# Patient Record
Sex: Female | Born: 1990 | Race: Black or African American | Hispanic: No | Marital: Single | Smoking: Never smoker
Health system: Southern US, Community
[De-identification: ages and names within clinical notes are randomized; demographics above are authoritative.]

---

## 2012-06-19 ENCOUNTER — Encounter (HOSPITAL_COMMUNITY): Payer: Self-pay | Admitting: *Deleted

## 2012-06-19 ENCOUNTER — Emergency Department (HOSPITAL_COMMUNITY)
Admission: EM | Admit: 2012-06-19 | Discharge: 2012-06-19 | Disposition: A | Discharge status: Home / Self Care | Payer: No Typology Code available for payment source | Attending: Emergency Medicine | Admitting: Emergency Medicine

## 2012-06-19 DIAGNOSIS — K0889 Other specified disorders of teeth and supporting structures: Secondary | ICD-10-CM

## 2012-06-19 DIAGNOSIS — K089 Disorder of teeth and supporting structures, unspecified: Secondary | ICD-10-CM | POA: Insufficient documentation

## 2012-06-19 MED ORDER — PENICILLIN V POTASSIUM 500 MG PO TABS: 500.0000 mg | ORAL_TABLET | Freq: Three times a day (TID) | ORAL | Status: AC

## 2012-06-19 MED ORDER — HYDROCODONE-ACETAMINOPHEN 5-500 MG PO TABS: 1.0000 | ORAL_TABLET | Freq: Four times a day (QID) | ORAL | Status: AC | PRN

## 2012-06-19 NOTE — ED Provider Notes (Signed)
History     CSN: [id number]  Arrival date & time 06/19/12  4098   First MD Initiated Contact with Patient 06/19/12 0815      Chief Complaint  Patient presents with  . Dental Pain    (Consider location/radiation/quality/duration/timing/severity/associated sxs/prior treatment) HPI Comments: Patient with history of right upper molar that is decayed.  Last night struck left side of face on handrail.  Complains of mouth pain.  Patient is a 21 y.o. female presenting with tooth pain. The history is provided by the patient.  Dental PainThe primary symptoms include mouth pain. The symptoms began 2 days ago. The symptoms are worsening. The symptoms are new. The symptoms occur constantly.  Additional symptoms include: dental sensitivity to temperature, gum swelling and gum tenderness.    History reviewed. No pertinent past medical history.  History reviewed. No pertinent past surgical history.  No family history on file.  History  Substance Use Topics  . Smoking status: Never Smoker   . Smokeless tobacco: Not on file  . Alcohol Use: No    OB History    Grav Para Term Preterm Abortions TAB SAB Ect Mult Living                  Review of Systems  All other systems reviewed and are negative.    Allergies  Review of patient's allergies indicates no known allergies.  Home Medications  No current outpatient prescriptions on file.  BP 136/81  Temp 98.5 F (36.9 C) (Oral)  Resp 18  SpO2 100%  Physical Exam  Nursing note and vitals reviewed. Constitutional: She is oriented to person, place, and time. She appears well-developed and well-nourished. No distress.  HENT:  Head: Normocephalic and atraumatic.       The right upper 2nd molar is heavily decayed.  There is bruising to the inside of the left cheek but no laceration.  There is no malocclusion of the teeth.  Neck: Normal range of motion. Neck supple.  Neurological: She is alert and oriented to person, place, and time.    Skin: Skin is warm and dry. She is not diaphoretic.    ED Course  Procedures (including critical care time)  Labs Reviewed - No data to display No results found.   No diagnosis found.    MDM  Will treat with penicillin, lortab.  Needs dental follow up.        Geoffery Lyons, MD 06/19/12 [phone]
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
## 2013-04-19 ENCOUNTER — Encounter (HOSPITAL_COMMUNITY): Payer: Self-pay | Admitting: Emergency Medicine

## 2013-04-19 ENCOUNTER — Emergency Department (HOSPITAL_COMMUNITY)
Admission: EM | Admit: 2013-04-19 | Discharge: 2013-04-19 | Disposition: A | Discharge status: Home / Self Care | Payer: No Typology Code available for payment source | Attending: Emergency Medicine | Admitting: Emergency Medicine

## 2013-04-19 DIAGNOSIS — K089 Disorder of teeth and supporting structures, unspecified: Secondary | ICD-10-CM | POA: Insufficient documentation

## 2013-04-19 DIAGNOSIS — Z9889 Other specified postprocedural states: Secondary | ICD-10-CM | POA: Insufficient documentation

## 2013-04-19 DIAGNOSIS — K0889 Other specified disorders of teeth and supporting structures: Secondary | ICD-10-CM

## 2013-04-19 MED ORDER — HYDROCODONE-ACETAMINOPHEN 5-325 MG PO TABS: 1.0000 | ORAL_TABLET | ORAL | Status: AC | PRN

## 2013-04-19 MED ORDER — HYDROCODONE-ACETAMINOPHEN 5-325 MG PO TABS
1.0000 | ORAL_TABLET | Freq: Once | ORAL | Status: AC
  Administered 2013-04-19: 1 via ORAL
  Filled 2013-04-19: qty 1

## 2013-04-19 MED ORDER — PENICILLIN V POTASSIUM 500 MG PO TABS: 500.0000 mg | ORAL_TABLET | Freq: Three times a day (TID) | ORAL | Status: AC

## 2013-04-19 NOTE — ED Notes (Signed)
PT. REPORTS RIGHT UPPER MOLAR PAIN ONSET LAST NIGHT UNRELIEVED BY OTC IBUPROFEN AND BC POWDER.

## 2013-04-19 NOTE — ED Notes (Signed)
Pt. States that she had a filing placed in her right upper molar 8 yrs. Ago and 5 months ago it fell out. Started hurting around 2300 last night.

## 2013-04-19 NOTE — ED Provider Notes (Signed)
Medical screening examination/treatment/procedure(s) were performed by non-physician practitioner and as supervising physician I was immediately available for consultation/collaboration.  Olivia Mackie, MD 04/19/13 [phone]

## 2013-04-19 NOTE — ED Provider Notes (Signed)
History     CSN: [id number]  Arrival date & time 04/19/13  0413   None     Chief Complaint  Patient presents with  . Dental Pain    (Consider location/radiation/quality/duration/timing/severity/associated sxs/prior treatment) HPI History provided by pt.   Pt has had had right upper toothache since last night.  Reports that her filling came out.  No relief w/ ibuprofen or goody powder.  No associated fever.  Does not have a dentist.  History reviewed. No pertinent past medical history.  History reviewed. No pertinent past surgical history.  No family history on file.  History  Substance Use Topics  . Smoking status: Never Smoker   . Smokeless tobacco: Not on file  . Alcohol Use: No    OB History   Grav Para Term Preterm Abortions TAB SAB Ect Mult Living                  Review of Systems  All other systems reviewed and are negative.    Allergies  Review of patient's allergies indicates no known allergies.  Home Medications   Current Outpatient Rx  Name  Route  Sig  Dispense  Refill  . ibuprofen (ADVIL,MOTRIN) 200 MG tablet   Oral   Take 200 mg by mouth every 6 (six) hours as needed for pain.         Marland Kitchen HYDROcodone-acetaminophen (NORCO/VICODIN) 5-325 MG per tablet   Oral   Take 1 tablet by mouth every 4 (four) hours as needed for pain.   12 tablet   0   . penicillin v potassium (VEETID) 500 MG tablet   Oral   Take 1 tablet (500 mg total) by mouth 3 (three) times daily.   30 tablet   0     BP 149/112  Pulse 104  Temp(Src) 99.3 F (37.4 C) (Oral)  Resp 14  SpO2 100%  LMP 03/17/2013  Physical Exam  Nursing note and vitals reviewed. Constitutional: She is oriented to person, place, and time. She appears well-developed and well-nourished. No distress.  HENT:  Head: Normocephalic and atraumatic.  Right upper second molar w/ advanced carie.  Extremely ttp.  Multiple other fillings.  Mild, diffuse gingiva.  No edema of buccal mucosa.    Eyes:   Normal appearance  Neck: Normal range of motion.  Pulmonary/Chest: Effort normal.  Musculoskeletal: Normal range of motion.  Lymphadenopathy:    She has no cervical adenopathy.  Neurological: She is alert and oriented to person, place, and time.  Psychiatric: She has a normal mood and affect. Her behavior is normal.    ED Course  Procedures (including critical care time)  Labs Reviewed - No data to display No results found.   1. Toothache       MDM  22yo F presents w/ right upper 2nd molar pain.  Exam concerning for periapical abscess.  Prescribed penicillin and 12 vicodin and referred to dentist on call.  She is aware that she should call to scheduled appt w/in 48 hours. Return precautions discussed.         Otilio Miu, PA-C 04/19/13 [phone]

## 2017-04-16 ENCOUNTER — Encounter (HOSPITAL_COMMUNITY): Payer: Self-pay | Admitting: Emergency Medicine

## 2017-04-16 ENCOUNTER — Emergency Department (HOSPITAL_COMMUNITY): Payer: No Typology Code available for payment source

## 2017-04-16 ENCOUNTER — Emergency Department (HOSPITAL_COMMUNITY)
Admission: EM | Admit: 2017-04-16 | Discharge: 2017-04-16 | Disposition: A | Discharge status: Home / Self Care | Payer: No Typology Code available for payment source | Attending: Emergency Medicine | Admitting: Emergency Medicine

## 2017-04-16 DIAGNOSIS — R1110 Vomiting, unspecified: Secondary | ICD-10-CM | POA: Diagnosis not present

## 2017-04-16 DIAGNOSIS — R079 Chest pain, unspecified: Secondary | ICD-10-CM | POA: Insufficient documentation

## 2017-04-16 DIAGNOSIS — S161XXA Strain of muscle, fascia and tendon at neck level, initial encounter: Secondary | ICD-10-CM | POA: Diagnosis not present

## 2017-04-16 DIAGNOSIS — R51 Headache: Secondary | ICD-10-CM | POA: Diagnosis not present

## 2017-04-16 DIAGNOSIS — Y93I9 Activity, other involving external motion: Secondary | ICD-10-CM | POA: Insufficient documentation

## 2017-04-16 DIAGNOSIS — Y92410 Unspecified street and highway as the place of occurrence of the external cause: Secondary | ICD-10-CM | POA: Diagnosis not present

## 2017-04-16 DIAGNOSIS — R42 Dizziness and giddiness: Secondary | ICD-10-CM | POA: Diagnosis not present

## 2017-04-16 DIAGNOSIS — Y999 Unspecified external cause status: Secondary | ICD-10-CM | POA: Diagnosis not present

## 2017-04-16 DIAGNOSIS — S199XXA Unspecified injury of neck, initial encounter: Secondary | ICD-10-CM | POA: Diagnosis present

## 2017-04-16 DIAGNOSIS — M25512 Pain in left shoulder: Secondary | ICD-10-CM | POA: Diagnosis not present

## 2017-04-16 DIAGNOSIS — R55 Syncope and collapse: Secondary | ICD-10-CM | POA: Insufficient documentation

## 2017-04-16 MED ORDER — IBUPROFEN 800 MG PO TABS
800.0000 mg | ORAL_TABLET | Freq: Once | ORAL | Status: AC
  Administered 2017-04-16: 800 mg via ORAL
  Filled 2017-04-16: qty 1

## 2017-04-16 MED ORDER — OXYCODONE-ACETAMINOPHEN 5-325 MG PO TABS
1.0000 | ORAL_TABLET | Freq: Once | ORAL | Status: AC
  Administered 2017-04-16: 1 via ORAL
  Filled 2017-04-16: qty 1

## 2017-04-16 MED ORDER — ONDANSETRON 4 MG PO TBDP
4.0000 mg | ORAL_TABLET | Freq: Once | ORAL | Status: AC
  Administered 2017-04-16: 4 mg via ORAL
  Filled 2017-04-16: qty 1

## 2017-04-16 MED ORDER — CYCLOBENZAPRINE HCL 10 MG PO TABS: 10.0000 mg | ORAL_TABLET | Freq: Two times a day (BID) | ORAL | 0 refills | Status: AC | PRN
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
## 2018-04-23 ENCOUNTER — Other Ambulatory Visit: Payer: Self-pay | Admitting: Nurse Practitioner

## 2018-04-23 ENCOUNTER — Ambulatory Visit
Admission: RE | Admit: 2018-04-23 | Discharge: 2018-04-23 | Disposition: A | Discharge status: Home / Self Care | Payer: No Typology Code available for payment source | Source: Ambulatory Visit | Attending: Nurse Practitioner | Admitting: Nurse Practitioner

## 2018-04-23 DIAGNOSIS — Z0000 Encounter for general adult medical examination without abnormal findings: Secondary | ICD-10-CM

## 2020-08-10 ENCOUNTER — Other Ambulatory Visit: Payer: No Typology Code available for payment source

## 2020-08-10 DIAGNOSIS — Z20822 Contact with and (suspected) exposure to covid-19: Secondary | ICD-10-CM

## 2020-08-11 LAB — NOVEL CORONAVIRUS, NAA: SARS-CoV-2, NAA: NOT DETECTED

## 2020-08-11 LAB — SARS-COV-2, NAA 2 DAY TAT

## 2020-08-24 ENCOUNTER — Other Ambulatory Visit: Payer: No Typology Code available for payment source

## 2020-08-24 DIAGNOSIS — Z20822 Contact with and (suspected) exposure to covid-19: Secondary | ICD-10-CM

## 2020-08-25 LAB — NOVEL CORONAVIRUS, NAA: SARS-CoV-2, NAA: NOT DETECTED

## 2020-08-25 LAB — SARS-COV-2, NAA 2 DAY TAT

## 2020-09-07 ENCOUNTER — Other Ambulatory Visit: Payer: No Typology Code available for payment source

## 2020-09-08 ENCOUNTER — Other Ambulatory Visit: Payer: No Typology Code available for payment source

## 2020-09-18 ENCOUNTER — Other Ambulatory Visit: Payer: No Typology Code available for payment source

## 2020-09-18 DIAGNOSIS — Z20822 Contact with and (suspected) exposure to covid-19: Secondary | ICD-10-CM

## 2020-09-19 LAB — SARS-COV-2, NAA 2 DAY TAT

## 2020-09-19 LAB — NOVEL CORONAVIRUS, NAA: SARS-CoV-2, NAA: NOT DETECTED

## 2020-09-25 ENCOUNTER — Other Ambulatory Visit: Payer: No Typology Code available for payment source

## 2020-09-25 DIAGNOSIS — Z20822 Contact with and (suspected) exposure to covid-19: Secondary | ICD-10-CM

## 2020-09-26 LAB — NOVEL CORONAVIRUS, NAA: SARS-CoV-2, NAA: NOT DETECTED

## 2020-09-26 LAB — SARS-COV-2, NAA 2 DAY TAT

## 2020-10-01 ENCOUNTER — Other Ambulatory Visit: Payer: No Typology Code available for payment source

## 2020-10-01 DIAGNOSIS — Z20822 Contact with and (suspected) exposure to covid-19: Secondary | ICD-10-CM

## 2020-10-02 ENCOUNTER — Other Ambulatory Visit: Payer: No Typology Code available for payment source

## 2020-10-02 LAB — NOVEL CORONAVIRUS, NAA: SARS-CoV-2, NAA: NOT DETECTED

## 2020-10-02 LAB — SARS-COV-2, NAA 2 DAY TAT

## 2020-10-09 ENCOUNTER — Other Ambulatory Visit: Payer: No Typology Code available for payment source

## 2020-10-09 DIAGNOSIS — Z20822 Contact with and (suspected) exposure to covid-19: Secondary | ICD-10-CM

## 2020-10-10 LAB — SARS-COV-2, NAA 2 DAY TAT

## 2020-10-10 LAB — NOVEL CORONAVIRUS, NAA: SARS-CoV-2, NAA: NOT DETECTED

## 2020-10-16 ENCOUNTER — Other Ambulatory Visit: Payer: No Typology Code available for payment source

## 2020-10-16 DIAGNOSIS — Z20822 Contact with and (suspected) exposure to covid-19: Secondary | ICD-10-CM

## 2020-10-17 LAB — NOVEL CORONAVIRUS, NAA: SARS-CoV-2, NAA: NOT DETECTED

## 2020-10-17 LAB — SARS-COV-2, NAA 2 DAY TAT

## 2020-10-23 ENCOUNTER — Other Ambulatory Visit: Payer: No Typology Code available for payment source

## 2020-10-30 ENCOUNTER — Other Ambulatory Visit: Payer: No Typology Code available for payment source

## 2020-11-06 ENCOUNTER — Other Ambulatory Visit: Payer: No Typology Code available for payment source

## 2020-12-25 ENCOUNTER — Other Ambulatory Visit: Payer: No Typology Code available for payment source

## 2021-02-01 ENCOUNTER — Ambulatory Visit
Admission: RE | Admit: 2021-02-01 | Discharge: 2021-02-01 | Disposition: A | Discharge status: Home / Self Care | Payer: 59 | Source: Ambulatory Visit | Attending: Chiropractic Medicine | Admitting: Chiropractic Medicine

## 2021-02-01 ENCOUNTER — Other Ambulatory Visit: Payer: Self-pay | Admitting: Chiropractic Medicine

## 2021-02-01 DIAGNOSIS — M549 Dorsalgia, unspecified: Secondary | ICD-10-CM

## 2021-02-01 DIAGNOSIS — G43909 Migraine, unspecified, not intractable, without status migrainosus: Secondary | ICD-10-CM

## 2021-02-01 DIAGNOSIS — R519 Headache, unspecified: Secondary | ICD-10-CM

## 2021-08-14 IMAGING — CR DG LUMBAR SPINE COMPLETE 4+V
5 series · 5 of 5 positions shown · non-contrast
Comparison: None.

CLINICAL DATA: Chronic low back pain.

EXAM:
LUMBAR SPINE - COMPLETE 4+ VIEW

[w lumbar spine ap]
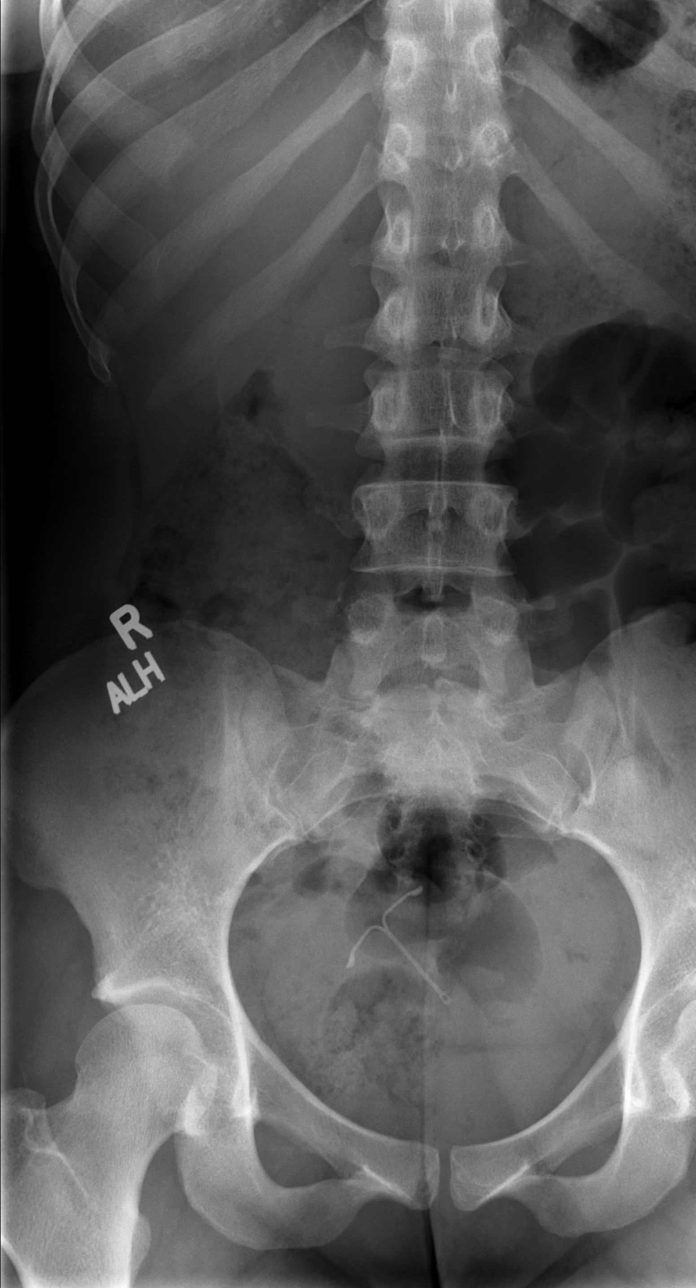

[w lumbar spine obl (1 of 2)]
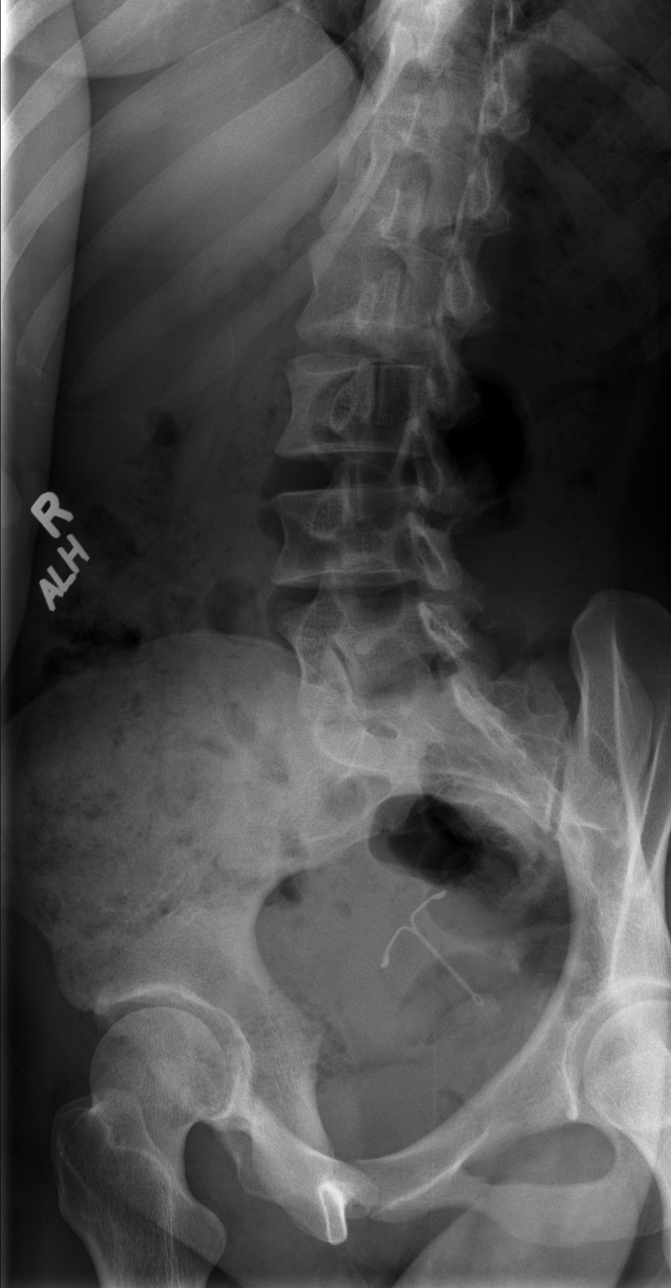

[w lumbar spine obl (2 of 2)]
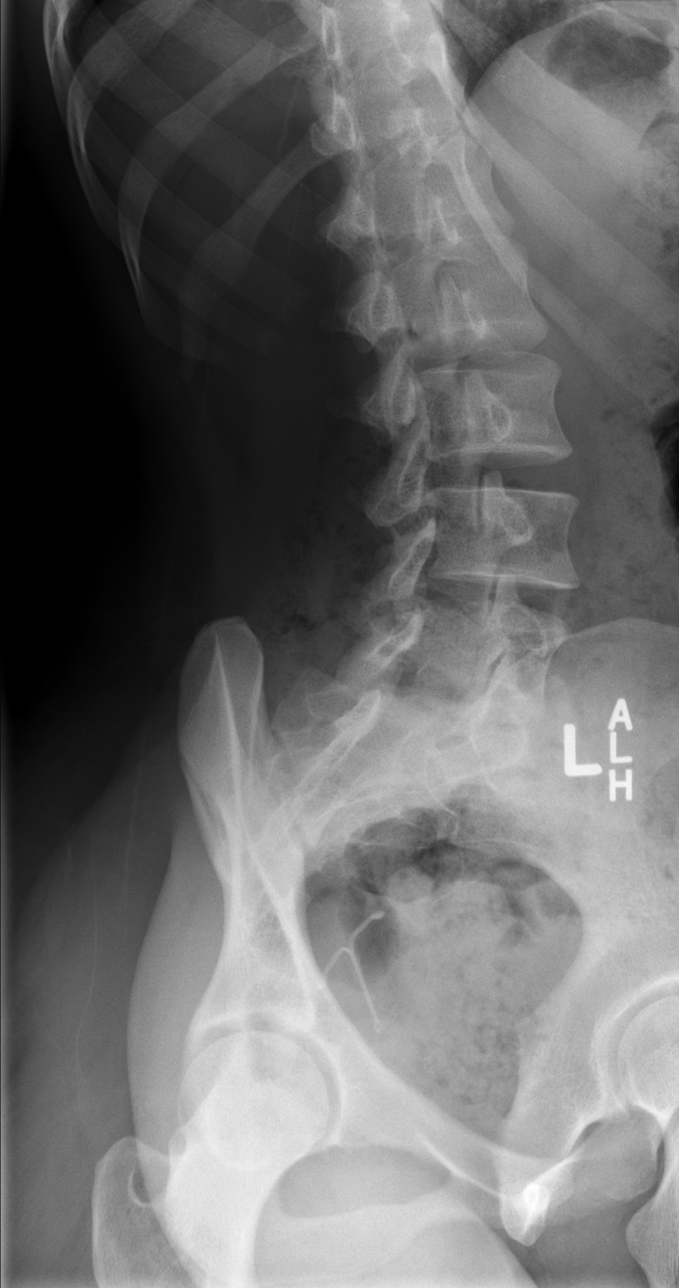

[w lumbar spine lat]
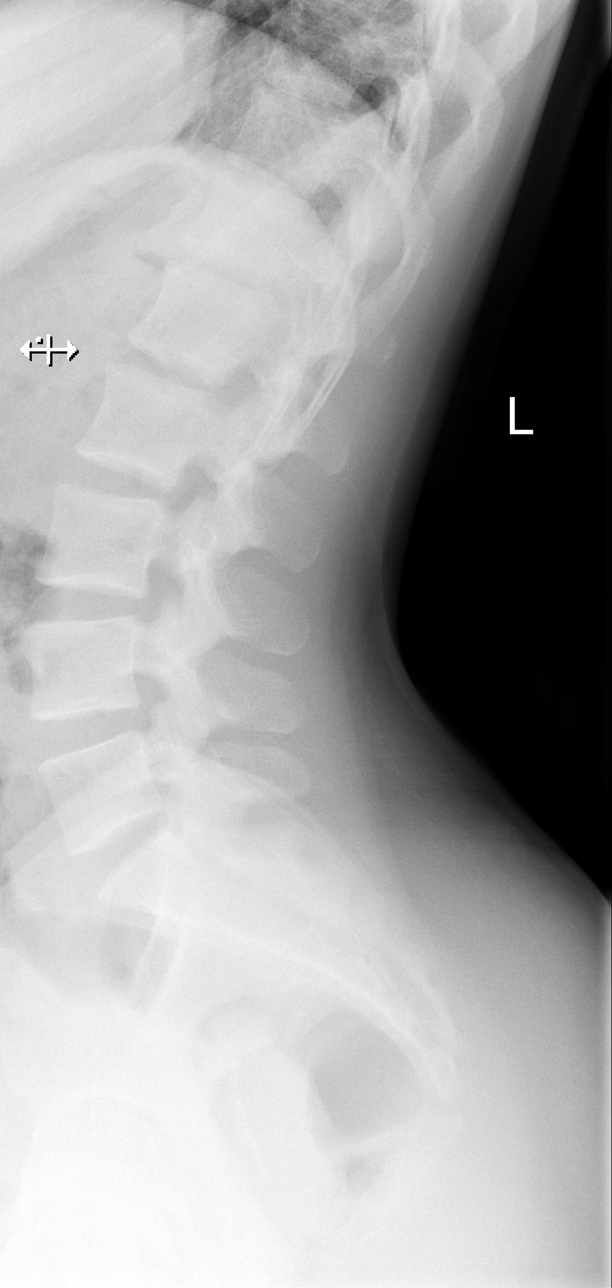

[w lumbar l-5 s-1 spot]
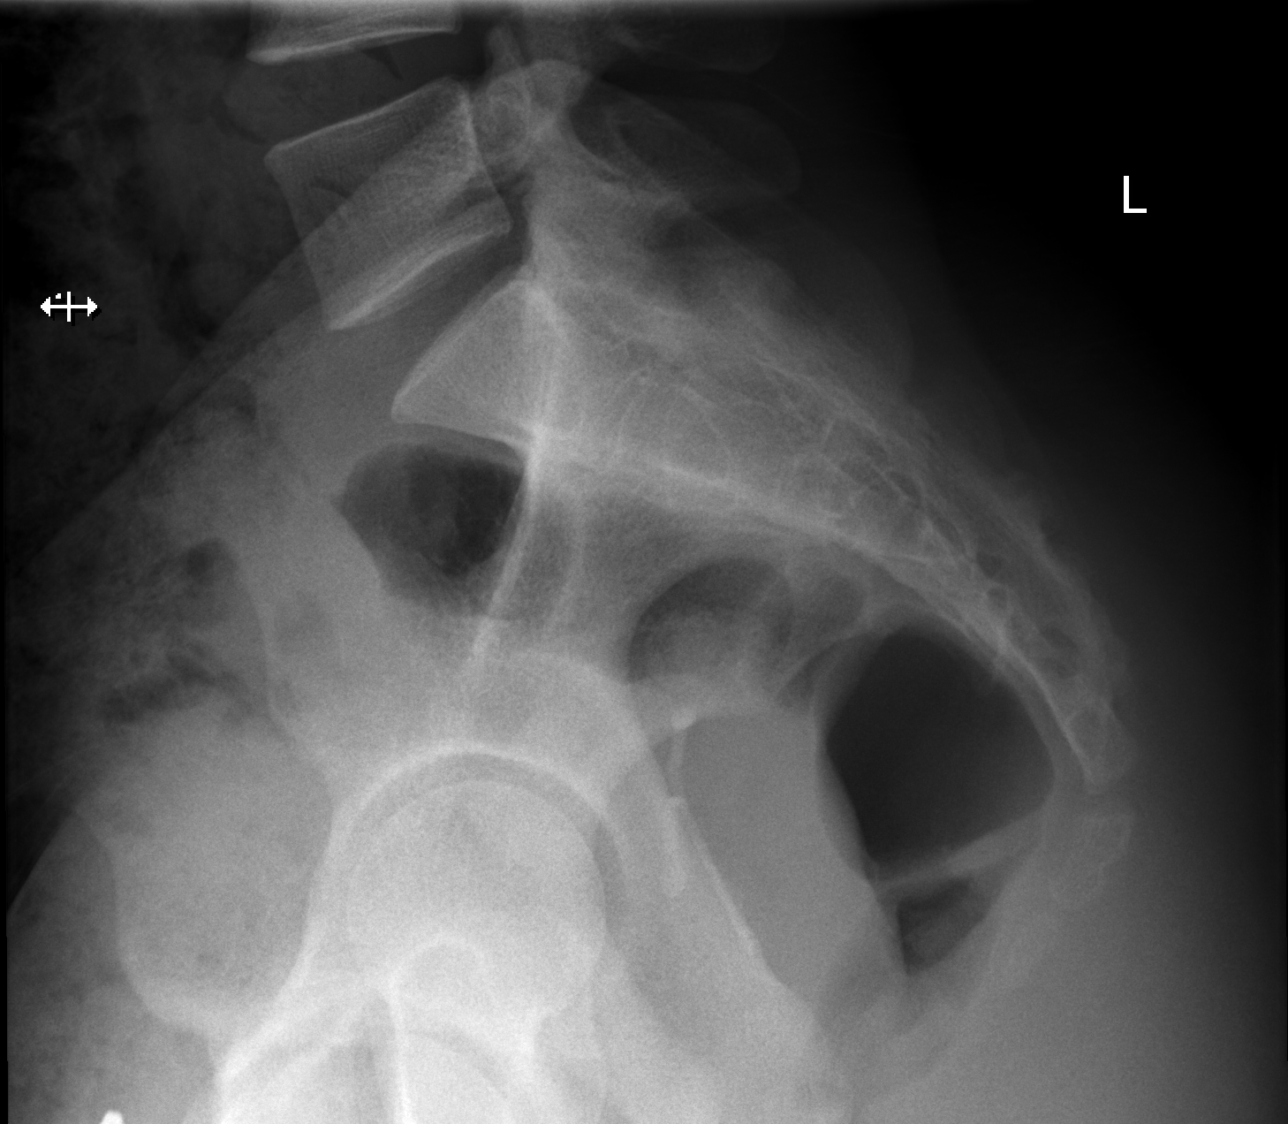

[5 of 5 positions shown; findings below may reference images not displayed]

FINDINGS: There is no evidence of lumbar spine fracture. Alignment is normal.
Intervertebral disc spaces are maintained. IUD noted.
IMPRESSION: Normal lumbar spine.
